# Patient Record
Sex: Female | Born: 1955 | Race: White | Hispanic: No | Marital: Single | Smoking: Never smoker
Health system: Southern US, Community
[De-identification: ages and names within clinical notes are randomized; demographics above are authoritative.]

## PROBLEM LIST (undated history)

## (undated) DIAGNOSIS — M1990 Unspecified osteoarthritis, unspecified site: Secondary | ICD-10-CM

## (undated) HISTORY — DX: Unspecified osteoarthritis, unspecified site: M19.90

---

## 2018-04-23 ENCOUNTER — Emergency Department: Payer: Medicare Other

## 2018-04-23 ENCOUNTER — Emergency Department
Admission: EM | Admit: 2018-04-23 | Discharge: 2018-04-23 | Disposition: A | Discharge status: Home / Self Care | Payer: Medicare Other | Attending: Emergency Medicine | Admitting: Emergency Medicine

## 2018-04-23 DIAGNOSIS — R079 Chest pain, unspecified: Secondary | ICD-10-CM

## 2018-04-23 DIAGNOSIS — K2100 Gastro-esophageal reflux disease with esophagitis, without bleeding: Secondary | ICD-10-CM

## 2018-04-23 DIAGNOSIS — R1013 Epigastric pain: Secondary | ICD-10-CM

## 2018-04-23 LAB — IHS D-DIMER: D-Dimer: <0.27 ug/mL FEU (ref 0.00–0.70)

## 2018-04-23 LAB — CBC AND DIFFERENTIAL
Absolute NRBC: 0 10*3/uL (ref 0.00–0.00)
Basophils Absolute Automated: 0.03 10*3/uL (ref 0.00–0.08)
Basophils Automated: 0.8 %
Eosinophils Absolute Automated: 0.1 10*3/uL (ref 0.00–0.44)
Eosinophils Automated: 2.7 %
Hematocrit: 37.1 % (ref 34.7–43.7)
Hgb: 12.3 g/dL (ref 11.4–14.8)
Immature Granulocytes Absolute: 0.01 10*3/uL (ref 0.00–0.07)
Immature Granulocytes: 0.3 %
Lymphocytes Absolute Automated: 0.73 10*3/uL (ref 0.42–3.22)
Lymphocytes Automated: 19.6 %
MCH: 30.5 pg (ref 25.1–33.5)
MCHC: 33.2 g/dL (ref 31.5–35.8)
MCV: 92.1 fL (ref 78.0–96.0)
MPV: 12.3 fL (ref 8.9–12.5)
Monocytes Absolute Automated: 0.35 10*3/uL (ref 0.21–0.85)
Monocytes: 9.4 %
Neutrophils Absolute: 2.5 10*3/uL (ref 1.10–6.33)
Neutrophils: 67.2 %
Nucleated RBC: 0 /100 WBC (ref 0.0–0.0)
Platelets: 198 10*3/uL (ref 142–346)
RBC: 4.03 10*6/uL (ref 3.90–5.10)
RDW: 15 % (ref 11–15)
WBC: 3.72 10*3/uL (ref 3.10–9.50)

## 2018-04-23 LAB — URINALYSIS, REFLEX TO MICROSCOPIC EXAM IF INDICATED
Bilirubin, UA: NEGATIVE
Blood, UA: NEGATIVE
Glucose, UA: NEGATIVE
Ketones UA: NEGATIVE
Nitrite, UA: NEGATIVE
Protein, UR: NEGATIVE
Specific Gravity UA: 1.015 (ref 1.001–1.035)
Urine pH: 7.5 (ref 5.0–8.0)
Urobilinogen, UA: 0.2 mg/dL

## 2018-04-23 LAB — COMPREHENSIVE METABOLIC PANEL
ALT: 18 U/L (ref 0–55)
AST (SGOT): 20 U/L (ref 5–34)
Albumin/Globulin Ratio: 1.7 (ref 0.9–2.2)
Albumin: 4.1 g/dL (ref 3.5–5.0)
Alkaline Phosphatase: 95 U/L (ref 37–106)
Anion Gap: 11 (ref 5.0–15.0)
BUN: 13 mg/dL (ref 7.0–19.0)
Bilirubin, Total: 0.5 mg/dL (ref 0.2–1.2)
CO2: 23 mEq/L (ref 22–29)
Calcium: 9.7 mg/dL (ref 8.5–10.5)
Chloride: 106 mEq/L (ref 100–111)
Creatinine: 0.8 mg/dL (ref 0.6–1.0)
Globulin: 2.4 g/dL (ref 2.0–3.6)
Glucose: 96 mg/dL (ref 70–100)
Potassium: 4 mEq/L (ref 3.5–5.1)
Protein, Total: 6.5 g/dL (ref 6.0–8.3)
Sodium: 140 mEq/L (ref 136–145)

## 2018-04-23 LAB — PT/INR
PT INR: 1 (ref 0.9–1.1)
PT: 13.1 s (ref 12.6–15.0)

## 2018-04-23 LAB — LIPASE: Lipase: 24 U/L (ref 8–78)

## 2018-04-23 LAB — GFR: EGFR: >60

## 2018-04-23 LAB — TROPONIN I: Troponin I: 0.02 ng/mL (ref 0.00–0.09)

## 2018-04-23 MED ORDER — SUCRALFATE 1 G PO TABS
1.00 g | ORAL_TABLET | Freq: Four times a day (QID) | ORAL | 0 refills | Status: AC
Start: 2018-04-23 — End: ?

## 2018-04-23 MED ORDER — FAMOTIDINE 10 MG/ML IV SOLN (WRAP)
20.00 mg | Freq: Once | INTRAVENOUS | Status: AC
Start: 2018-04-23 — End: 2018-04-23
  Administered 2018-04-23: 11:00:00 20 mg via INTRAVENOUS
  Filled 2018-04-23: qty 2

## 2018-04-23 MED ORDER — DICYCLOMINE HCL 10 MG PO CAPS
10.00 mg | ORAL_CAPSULE | Freq: Four times a day (QID) | ORAL | 0 refills | Status: DC
Start: 2018-04-23 — End: 2018-04-23

## 2018-04-23 MED ORDER — OMEPRAZOLE MAGNESIUM 20 MG PO TBEC
20.00 mg | DELAYED_RELEASE_TABLET | Freq: Every day | ORAL | 0 refills | Status: AC
Start: 2018-04-23 — End: 2018-05-07

## 2018-04-23 MED ORDER — SODIUM CHLORIDE 0.9 % IV BOLUS
1000.00 mL | Freq: Once | INTRAVENOUS | Status: AC
Start: 2018-04-23 — End: 2018-04-23
  Administered 2018-04-23: 11:00:00 1000 mL via INTRAVENOUS

## 2018-04-23 MED ORDER — ALUM & MAG HYDROXIDE-SIMETH 200-200-20 MG/5ML PO SUSP
30.00 mL | Freq: Once | ORAL | Status: AC
Start: 2018-04-23 — End: 2018-04-23
  Administered 2018-04-23: 11:00:00 30 mL via ORAL
  Filled 2018-04-23: qty 30

## 2018-04-23 MED ORDER — ONDANSETRON 4 MG PO TBDP
4.00 mg | ORAL_TABLET | Freq: Four times a day (QID) | ORAL | 0 refills | Status: AC | PRN
Start: 2018-04-23 — End: ?

## 2018-04-23 MED ORDER — LIDOCAINE VISCOUS HCL 2 % MT SOLN
10.00 mL | Freq: Once | OROMUCOSAL | Status: AC
Start: 2018-04-23 — End: 2018-04-23
  Administered 2018-04-23: 11:00:00 10 mL via OROMUCOSAL
  Filled 2018-04-23: qty 15

## 2018-04-23 NOTE — ED Triage Notes (Signed)
Pt with chest discomfort and complaints of some SOB during episodes.     Pt also complains of abdominal pain that radiates in from RUQ to epigastric.

## 2018-04-23 NOTE — Discharge Instructions (Signed)
Abdominal Pain     You have been diagnosed with abdominal (belly) pain. The cause of your pain is not yet known.     Many things can cause abdominal pain. Examples include viral infections and bowel (intestine) spasms. You might need another examination or more tests to find out why you have pain.     At this time, your pain does not seem to be caused by anything dangerous. You do not need surgery. You do not need to stay in the hospital.      Though we don’t believe your condition is dangerous right now, it is important to be careful. Sometimes a problem that seems mild can become serious later. This is why it is very important that you return here or go to the nearest Emergency Department unless you are 100% improved.     Return here or go to the nearest Emergency Department, or follow up with your physician in:  · 2-3 days.     Drink only clear liquids such as water, clear broth, sports drinks, or clear caffeine-free soft drinks, like 7-Up or Sprite, for the next:  · 24 hours.     YOU SHOULD SEEK MEDICAL ATTENTION IMMEDIATELY, EITHER HERE OR AT THE NEAREST EMERGENCY DEPARTMENT, IF ANY OF THE FOLLOWING OCCURS:  · Your pain does not go away or gets worse.  · You cannot keep fluids down or your vomit is dark green.   · You vomit blood or see blood in your stool. Blood might be bright red or dark red. It can also be black and look like tar.  · You have a fever (temperature higher than 100.4ºF / 38ºC) or shaking chills.  · Your skin or eyes look yellow or your urine looks brown.  · You have severe diarrhea.               Gastroesophageal Reflux Disease, GERD     You have been diagnosed with gastroesophageal reflux disease. This is sometimes called GERD.     With this condition, acid from the stomach backs up into the esophagus (food pipe). Most people have this as occasional heartburn. Sometimes, the backup happens so often that the symptoms are severe (serious). Over time, damage may be done to the  esophagus.     Medicines may be prescribed to lower acid secretion in the stomach or coat the esophagus. Take all medicines as directed.     There is no evidence that spicy foods cause reflux. Some people find that avoiding caffeine, alcohol, and spicy and acidic foods lowers discomfort. Just pay attention to what you eat. If a food makes symptoms worse, don't eat it.     Don't lie down after eating.     Don't smoke. Smoking may relax the esophagus muscles that keep acid in the stomach.     Obesity (being overweight) may make reflux symptoms worse. Talk to your doctor about a weight loss plan.     Do not eat less than 2-3 hours before bedtime.     You can buy antacids over the counter (without a prescription). Use them as directed to relieve symptoms.     Follow up as directed. You may be referred to a gastroenterologist (digestive system doctor) for further evaluation.     YOU SHOULD SEEK MEDICAL ATTENTION IMMEDIATELY, EITHER HERE OR AT THE NEAREST EMERGENCY DEPARTMENT, IF ANY OF THE FOLLOWING OCCURS:  · Your pain increases or changes.  · New or different chest pain.  · You are vomiting (throwing up) constantly or vomiting blood or material that looks like "coffee grounds."  ·   has blood, gets very dark or looks like tar.             Chest Pain of Unclear Etiology    You have been seen for chest pain. The cause of your pain is not yet known.    Your doctor has learned about your medical history, examined you, and checked any tests that were done. Still, it is unclear why you are having pain. The doctor thinks there is only a very small chance that your pain is caused by a life-threatening condition. Later, your primary care doctor might do more tests or check you again.    Sometimes chest pain is caused by a dangerous condition, like a heart attack, aorta injury, blood clot in the lung, or collapsed lung. It is unlikely that your pain is caused by a life-threatening condition if: Your chest pain lasts  only a few seconds at a time; you are not short of breath, nauseated (sick to your stomach), sweaty, or lightheaded; your pain gets worse when you twist or bend; your pain improves with exercise or hard work.    Chest pain is serious. It is VERY IMPORTANT that you follow up with your regular doctor and seek medical attention immediately here or at the nearest Emergency Department if your symptoms become worse or they change.    YOU SHOULD SEEK MEDICAL ATTENTION IMMEDIATELY, EITHER HERE OR AT THE NEAREST EMERGENCY DEPARTMENT, IF ANY OF THE FOLLOWING OCCURS:   Your pain gets worse.   Your pain makes you short of breath, nauseated, or sweaty.   Your pain gets worse when you walk, go up stairs, or exert yourself.   You feel weak, lightheaded, or faint.   It hurts to breathe.   Your leg swells.   Your symptoms get worse or you have new symptoms or concerns.

## 2018-04-23 NOTE — ED Provider Notes (Signed)
Physician/Midlevel provider first contact with patient: 04/23/18 1012         History     Chief Complaint   Patient presents with   . Abdominal Pain   . Chest Pain     62 YO female arrives ambulatory to ED with complaint of an exacerbation of her ongoing upper abdominal and chest pain overnight.  She has had problems with this intermittently since April, has been seen by her PCP who advised f/u with gastroenterologist.  She has been unable to arrange that appointment to date.   Last night started with an episode that was the worst it has been.  She feels waves of discomfort in the upper abdomen into her chest.  Her discomfort was associated with palpitations last night and a "slight" feeling of shortness of breath.      She denies correlation with exertion.  No pleuritic component to the pain.  No URI symptoms, cough, fever, vomiting, diarrhea, stool changes.      H/o hemachromatosis, arthritis for which she was taking meloxicam without food.  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        The history is provided by the patient. No language interpreter was used.   Abdominal Pain   Associated symptoms: chest pain and nausea    Associated symptoms: no constipation, no diarrhea and no vomiting    Chest Pain   Associated symptoms: abdominal pain, nausea and palpitations    Associated symptoms: no vomiting             Past Medical History:   Diagnosis Date   . Arthritis        History reviewed. No pertinent surgical history.    History reviewed. No pertinent family history.    Social  Social History   Substance Use Topics   . Smoking status: Never Smoker   . Smokeless tobacco: Never Used   . Alcohol use Yes      Comment: social       .     No Known Allergies    Home Medications     Med List Status:  Complete Set By: Christene Lye, RN at 04/23/2018 10:28 AM                Multiple Vitamin (TAB-A-VITE) Tab     Take 1 tablet by mouth           Review of Systems   Constitutional: Negative.    HENT:         Sometimes has a tickle in the throat.   Respiratory: Negative.    Cardiovascular: Positive for chest pain and palpitations. Negative for leg swelling.   Gastrointestinal: Positive for abdominal pain and nausea. Negative for blood in stool, constipation, diarrhea and vomiting.   Genitourinary: Negative.    Skin: Negative.    Neurological: Negative.    Hematological:        Hemachromatosis.   All other systems reviewed and are negative.      Physical Exam    BP: 129/67, Heart Rate: 65, Temp: 97.9 F (36.6 C), Resp Rate: 18, SpO2: 99 %, Weight: 74.8 kg    Physical Exam   Constitutional: She appears well-developed and well-nourished. No distress.   HENT:   Head: Normocephalic and atraumatic.   Mouth/Throat: Oropharynx is clear and moist. No oropharyngeal exudate.   Eyes: Conjunctivae are normal. Right eye exhibits no discharge. Left eye exhibits no discharge. No scleral icterus.   Neck: Normal range  of motion. Neck supple.   Cardiovascular: Normal rate and regular rhythm.    Pulmonary/Chest: Effort normal and breath sounds normal.   Abdominal: Soft. Bowel sounds are normal. She exhibits no distension and no mass. There is no rebound and no guarding.   Slight epigastric tenderness.  Neg murphy sign.   Musculoskeletal: Normal range of motion. She exhibits no edema or tenderness.   Lymphadenopathy:     She has no cervical adenopathy.   Neurological: She is alert. She exhibits normal muscle tone.   Skin: Skin is warm and dry. Capillary refill takes less than 2 seconds. No rash noted. She is not diaphoretic. No pallor.   Psychiatric: She has a normal mood and affect. Thought content normal.   Nursing note and vitals reviewed.        MDM and ED Course     ED Medication Orders     Start Ordered     Status Ordering Provider    04/23/18 1036 04/23/18 1035  famotidine (PEPCID) injection 20 mg  Once     Route: Intravenous  Ordered Dose: 20 mg     Last MAR action:  Given Jaxx Huish    04/23/18 1036 04/23/18 1035  lidocaine  viscous (XYLOCAINE) 2 % solution 10 mL  Once     Route: Mouth/Throat  Ordered Dose: 10 mL     Last MAR action:  Given Cherlyn Syring    04/23/18 1036 04/23/18 1035  alum & mag hydroxide-simethicone (MAALOX PLUS) 200-200-20 mg/5 mL suspension 30 mL  Once     Route: Oral  Ordered Dose: 30 mL     Last MAR action:  Given Greydon Betke    04/23/18 1036 04/23/18 1035  sodium chloride 0.9 % bolus 1,000 mL  Once     Route: Intravenous  Ordered Dose: 1,000 mL     Last MAR action:  Stopped Trenee Igoe             MDM:  Most likely reflux with esophageal spasm.  Will w/u.         EKG:  Rate 53  Rhythm sins bradycardia  O/w normal ECG    Cardiac monitor:  Sinus rhythm  Rate 58.        Feeling better following GI cocktail, although not completely relieved.      Discussed case with Dr. Achilles Dunk who examined patient.      Advised patient regarding nonprovoking diet to limit GER.  F/u with gastroenterologist.  Return prn new or worsening symptoms.      Wells Score      Value   Previous DVT or PE  1.5   HR greater than 100  0   Surgery within 4 weeks, or Immobilization in last 3 days  0   Clinical Signs/Symptoms of DVT  0   Alternative diagnosis less likely than PE  0   Hemoptysis  0   Malignancy with treatment within 6 months or palliative care  0   Wells Score for PE  1.5      PERC Score      Value   Age (read only)  greater than 50 years   HR >= 100  0   O2 Sat on Room Air < 95%  0   Prior History of Venous Thromboembolism  1   Trauma or Surgery within 4 weeks  0   Hemoptysis  0   Exogenous Estrogen  0   Unilateral Leg Swelling  0   PERC  Rule Score (Calculated)  2      Heart Score      Value   History  0   EKG  0   Risk Factors  -   Total (with age)  1   Onset of pain (time of START of last episode of chest pain)?  >6 hrs ago          Procedures    Clinical Impression & Disposition     Clinical Impression  Final diagnoses:   Chest pain in adult   Reflux esophagitis   Epigastric pain        ED Disposition     ED Disposition  Condition Date/Time Comment    Discharge  Sun Apr 23, 2018 12:54 PM Georga Kaufmann discharge to home/self care.    Condition at disposition: Stable           Discharge Medication List as of 04/23/2018 12:54 PM      START taking these medications    Details   omeprazole (PRILOSEC OTC) 20 MG tablet Take 1 tablet (20 mg total) by mouth daily for 14 days, Starting Sun 04/23/2018, Until Sun 05/07/2018, Print      ondansetron (ZOFRAN-ODT) 4 MG disintegrating tablet Take 1 tablet (4 mg total) by mouth every 6 (six) hours as needed for Nausea, Starting Sun 04/23/2018, Print      sucralfate (CARAFATE) 1 g tablet Take 1 tablet (1 g total) by mouth 4 (four) times daily, Starting Sun 04/23/2018, Print                       Davonna Belling, [place], NP  04/24/18 1610       Andris Baumann, MD  04/24/18 2225

## 2018-04-24 NOTE — Progress Notes (Signed)
Urine Culture negative, no further action needed.

## 2018-04-26 LAB — ECG 12-LEAD
Atrial Rate: 53 {beats}/min
P Axis: 52 degrees
P-R Interval: 138 ms
Q-T Interval: 468 ms
QRS Duration: 86 ms
QTC Calculation (Bezet): 439 ms
R Axis: 4 degrees
T Axis: 16 degrees
Ventricular Rate: 53 {beats}/min

## 2021-12-03 IMAGING — MR MRI LEFT SHOULDER WITHOUT CONTRAST
4 of 6 series · 23 of 40 positions shown · IV contrast (gadolinium)
Comparison: 11/23/2021 [facility] radiographs

________________________________________________________________________________________________ 
MRI LEFT SHOULDER WITHOUT CONTRAST, 12/03/2021 [DATE]: 
CLINICAL INDICATION: Left shoulder rotator cuff tear.
TECHNIQUE: Multiplanar, multiecho position MR images of the shoulder were 
performed without intravenous gadolinium enhancement. Patient was scanned on a 
1.5T magnet.

[Series 201: survey mst · axial · 10.0mm · 0.99mm/px · z∈[-40,+175]mm · 4 of 15 slices shown]
[im 1/15]
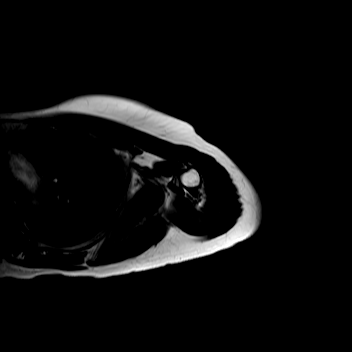
[im 5/15]
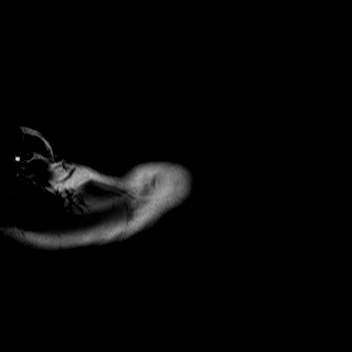
[im 10/15]
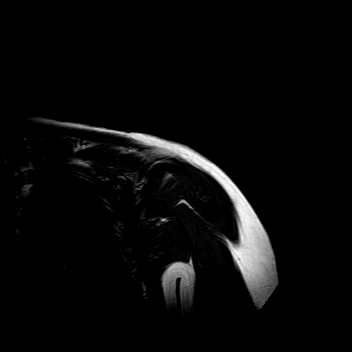
[im 15/15]
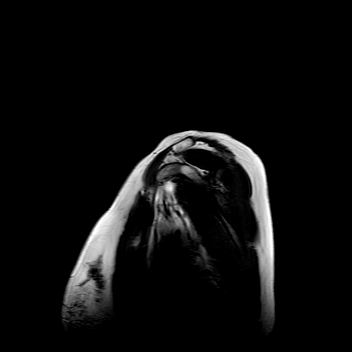

[Series 301: (person_name)_(person_name)_(person_name)* · axial · 3.5mm · 0.35mm/px · z∈[-84,+16]mm · 8 of 28 slices shown]
[im 1/28]
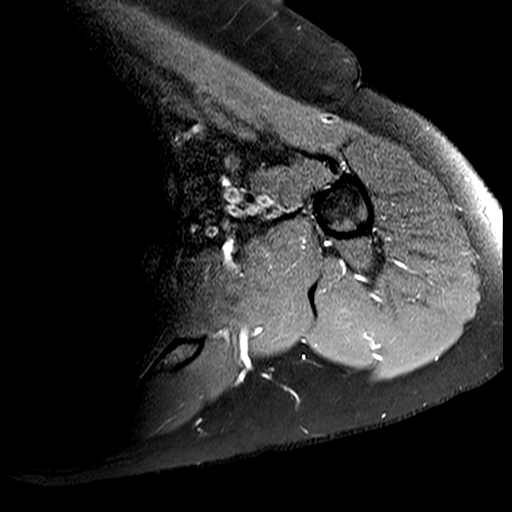
[im 4/28]
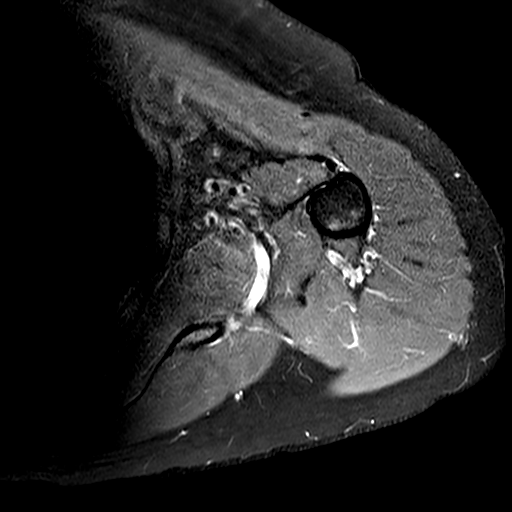
[im 8/28]
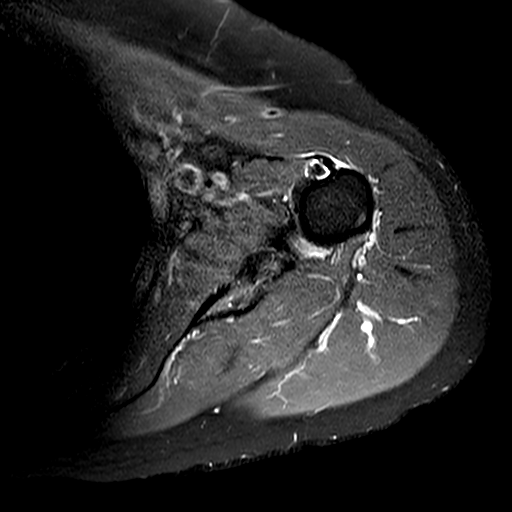
[im 12/28]
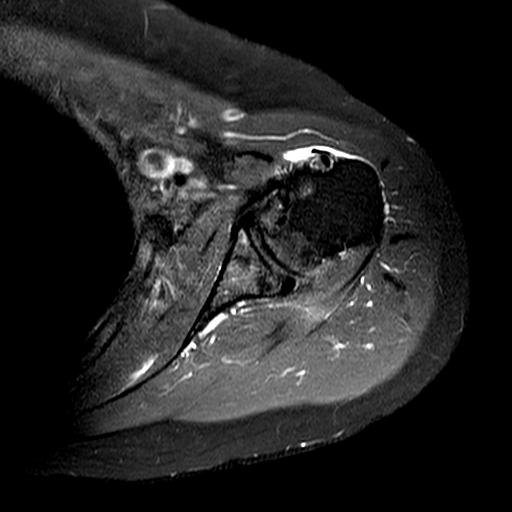
[im 16/28]
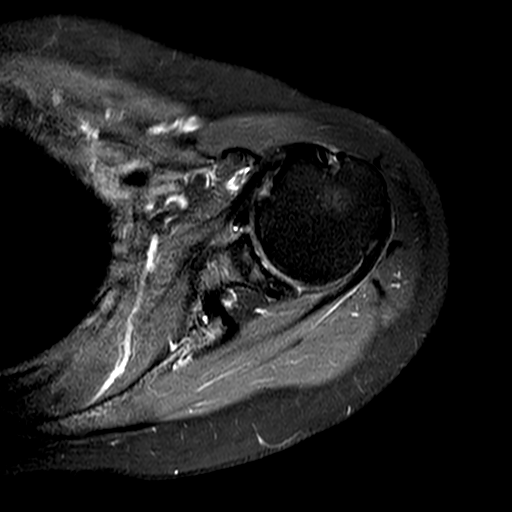
[im 20/28]
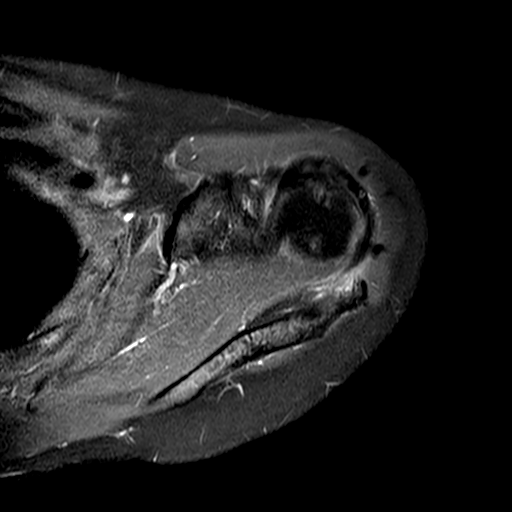
[im 24/28]
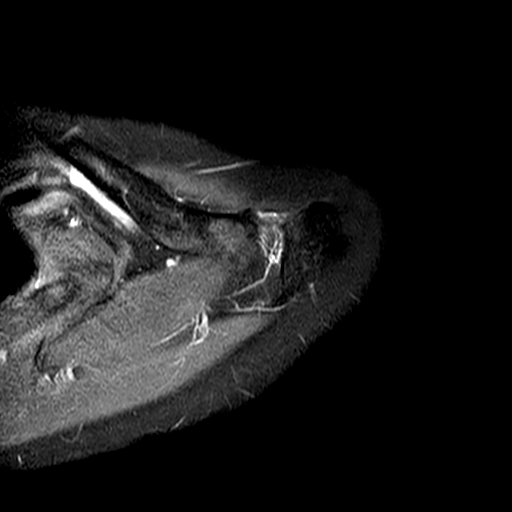
[im 28/28]
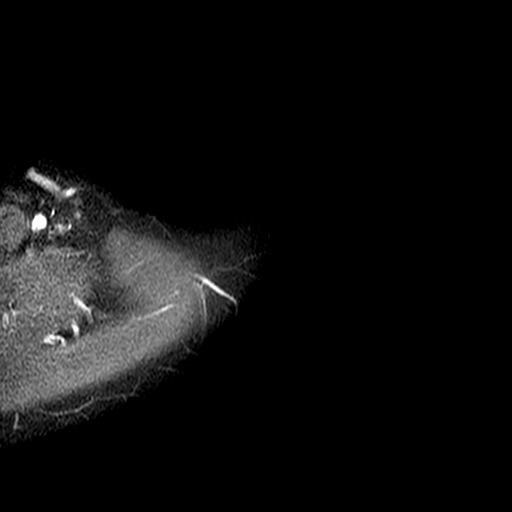

[Series 401: t2_fs_sag* · sagittal · 3.2mm · 0.37mm/px · 8 of 30 slices shown]
[im 1/30]
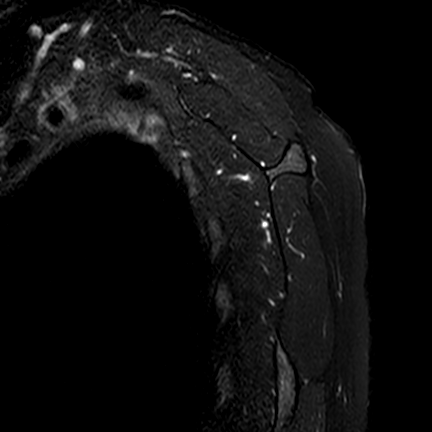
[im 5/30]
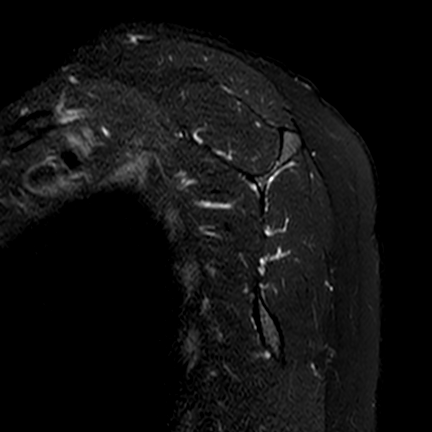
[im 9/30]
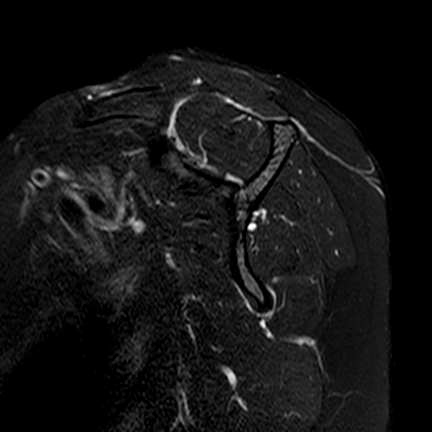
[im 13/30]
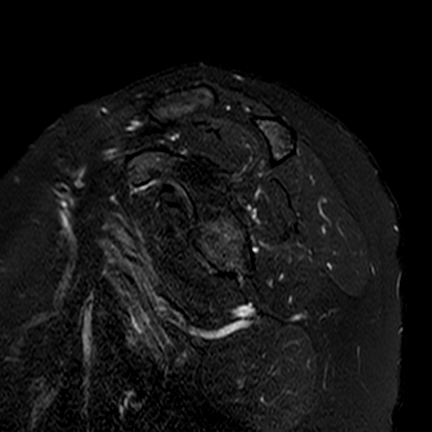
[im 17/30]
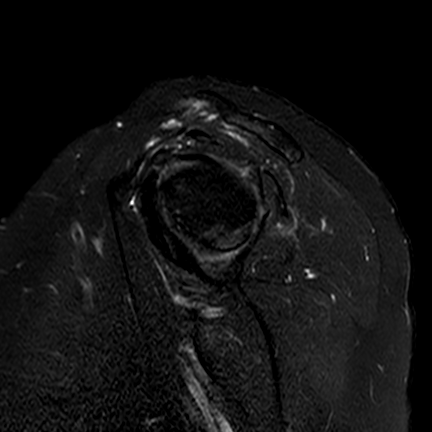
[im 21/30]
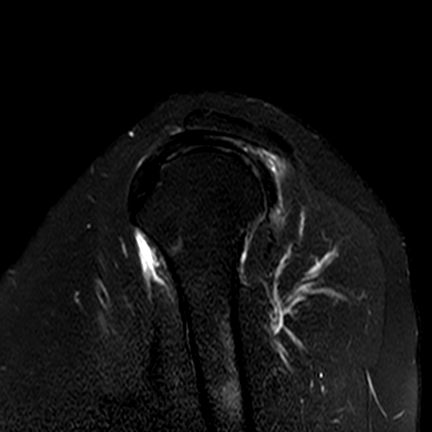
[im 25/30]
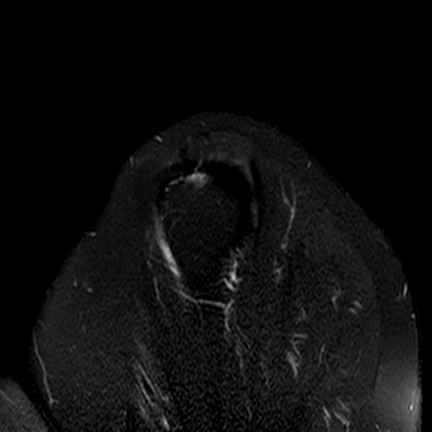
[im 30/30]
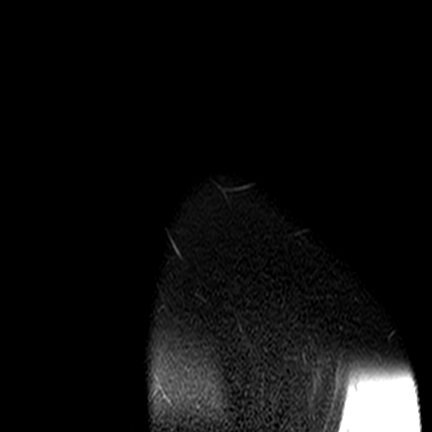

[Series 501: pd_fs_cor* · coronal · 3.5mm · 0.34mm/px · 3 of 22 slices shown]
[im 5/22]
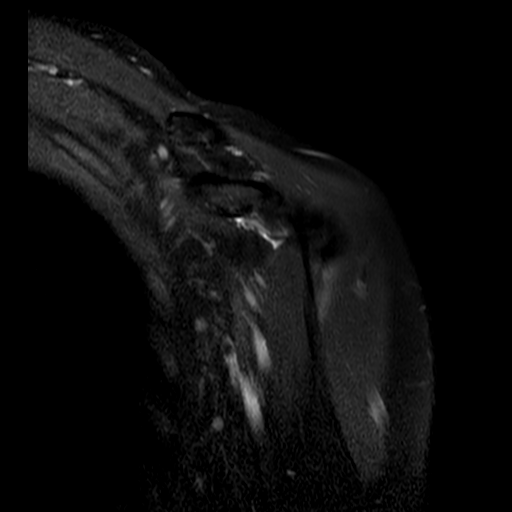
[im 13/22]
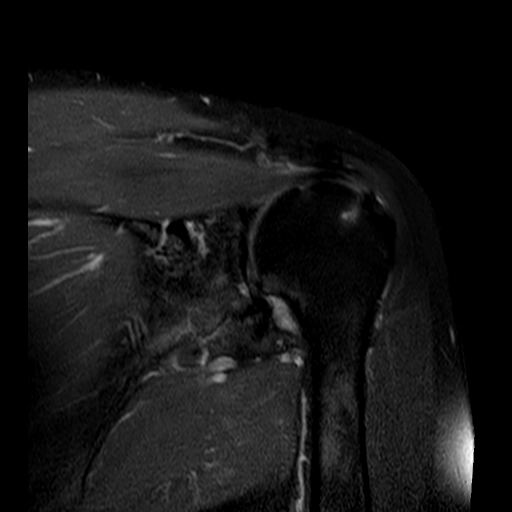
[im 22/22]
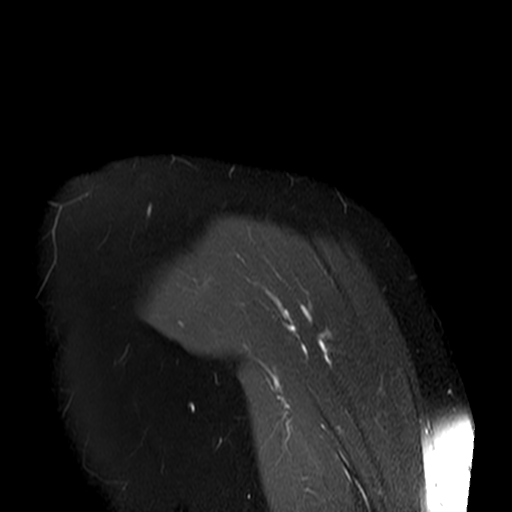

[23 of 40 positions shown; findings below may reference images not displayed]

FINDINGS: ROTATOR CUFF: The supraspinatus, infraspinatus, subscapularis and teres minor 
tendons are intact. The rotator cuff musculature is symmetric without mass, 
signal abnormality or atrophy. 
ACROMIOCLAVICULAR JOINT: The AC joint is preserved. The coracoacromial ligament 
is intact without prominent spurring at the acromial attachment. The 
acromioclavicular and coracoclavicular ligaments are preserved. The acromium is 
normal in morphology. 
GLENOHUMERAL JOINT: The humeral head is well located within the glenoid fossa. 
Mild glenohumeral joint degenerative change and partial thickness 
chondromalacia, small osteophytes and subcortical cysts. Superior/posterior 
labral tears. No paralabral cyst. Small amount of fluid and multiple calcified 
intra-articular bodies in the bicipital tendon sheath (up to 0.5 cm). The 
intra-articular portion of the long head of the biceps tendon is intact. 1.4 x 
0.5 x 0.4 cm well-corticated ossified body posterior to the glenoid. No shoulder 
joint effusion. Mild synovitis. 
BONES: The bone marrow signal intensity is negative for fracture. No Hill-Sachs 
defect.  
ADDITIONAL FINDINGS: Small amount of fluid in the subacromial/subdeltoid bursa, 
most marked anteriorly. The axillary region is negative. Subcutaneous tissues 
are negative.
IMPRESSION: 1.  No rotator cuff tear. 
2.  Mild glenohumeral joint degenerative change, superior/posterior labral 
tears, 1.4 x 0.5 x 0.4 cm ossified body posterior to the glenoid and mild 
synovitis. 
3.  Multiple calcified intra-articular bodies and small amount of fluid in the 
bicipital tendon sheath. Biceps tendon is intact.  
4.  Small amount of fluid in the subacromial/subdeltoid bursa
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
